# Patient Record
Sex: Female | Born: 1965 | Race: Black or African American | Hispanic: No | Marital: Married | Smoking: Never smoker
Health system: Southern US, Community
[De-identification: ages and names within clinical notes are randomized; demographics above are authoritative.]

## PROBLEM LIST (undated history)

## (undated) DIAGNOSIS — G5600 Carpal tunnel syndrome, unspecified upper limb: Secondary | ICD-10-CM

## (undated) DIAGNOSIS — J45909 Unspecified asthma, uncomplicated: Secondary | ICD-10-CM

## (undated) DIAGNOSIS — G4730 Sleep apnea, unspecified: Secondary | ICD-10-CM

## (undated) DIAGNOSIS — E785 Hyperlipidemia, unspecified: Secondary | ICD-10-CM

## (undated) DIAGNOSIS — I10 Essential (primary) hypertension: Secondary | ICD-10-CM

## (undated) DIAGNOSIS — N6009 Solitary cyst of unspecified breast: Secondary | ICD-10-CM

## (undated) DIAGNOSIS — D329 Benign neoplasm of meninges, unspecified: Secondary | ICD-10-CM

## (undated) DIAGNOSIS — E119 Type 2 diabetes mellitus without complications: Secondary | ICD-10-CM

## (undated) HISTORY — DX: Type 2 diabetes mellitus without complications: E11.9

## (undated) HISTORY — DX: Carpal tunnel syndrome, unspecified upper limb: G56.00

## (undated) HISTORY — DX: Benign neoplasm of meninges, unspecified: D32.9

## (undated) HISTORY — DX: Unspecified asthma, uncomplicated: J45.909

## (undated) HISTORY — DX: Essential (primary) hypertension: I10

## (undated) HISTORY — PX: TUBAL LIGATION: SHX77

## (undated) HISTORY — DX: Solitary cyst of unspecified breast: N60.09

## (undated) HISTORY — DX: Sleep apnea, unspecified: G47.30

## (undated) HISTORY — DX: Hyperlipidemia, unspecified: E78.5

## (undated) HISTORY — PX: ENDOMETRIAL ABLATION W/ NOVASURE: SUR434

## (undated) HISTORY — PX: CRANIOTOMY: SHX93

---

## 1997-07-30 ENCOUNTER — Emergency Department (HOSPITAL_COMMUNITY): Admission: EM | Admit: 1997-07-30 | Discharge: 1997-07-30 | Payer: Self-pay | Admitting: Emergency Medicine

## 2004-03-17 ENCOUNTER — Ambulatory Visit: Payer: Self-pay | Admitting: Family Medicine

## 2004-03-26 ENCOUNTER — Ambulatory Visit: Payer: Self-pay | Admitting: Family Medicine

## 2004-07-05 ENCOUNTER — Ambulatory Visit: Payer: Self-pay | Admitting: Family Medicine

## 2005-02-03 ENCOUNTER — Ambulatory Visit: Payer: Self-pay | Admitting: Internal Medicine

## 2005-04-25 ENCOUNTER — Ambulatory Visit: Payer: Self-pay

## 2006-05-02 ENCOUNTER — Ambulatory Visit: Payer: Self-pay | Admitting: Internal Medicine

## 2006-05-30 ENCOUNTER — Ambulatory Visit: Payer: Self-pay | Admitting: Obstetrics and Gynecology

## 2006-06-02 ENCOUNTER — Ambulatory Visit: Payer: Self-pay | Admitting: Obstetrics and Gynecology

## 2007-07-03 ENCOUNTER — Ambulatory Visit: Payer: Self-pay | Admitting: Internal Medicine

## 2008-02-05 ENCOUNTER — Ambulatory Visit: Payer: Self-pay | Admitting: Internal Medicine

## 2008-04-22 ENCOUNTER — Ambulatory Visit: Payer: Self-pay | Admitting: Unknown Physician Specialty

## 2008-07-29 ENCOUNTER — Ambulatory Visit: Payer: Self-pay | Admitting: Internal Medicine

## 2009-06-24 ENCOUNTER — Ambulatory Visit: Payer: Self-pay | Admitting: Internal Medicine

## 2009-09-29 ENCOUNTER — Ambulatory Visit: Payer: Self-pay | Admitting: Internal Medicine

## 2009-10-01 ENCOUNTER — Ambulatory Visit: Payer: Self-pay | Admitting: Internal Medicine

## 2010-01-04 ENCOUNTER — Ambulatory Visit: Payer: Self-pay | Admitting: Internal Medicine

## 2010-02-15 ENCOUNTER — Ambulatory Visit: Payer: Self-pay

## 2010-07-12 ENCOUNTER — Ambulatory Visit: Payer: Self-pay

## 2011-04-06 ENCOUNTER — Ambulatory Visit: Payer: Self-pay | Admitting: Family Medicine

## 2013-02-28 ENCOUNTER — Inpatient Hospital Stay: Payer: Self-pay | Admitting: Internal Medicine

## 2013-02-28 HISTORY — PX: BREAST CYST EXCISION: SHX579

## 2013-02-28 HISTORY — PX: BREAST CYST ASPIRATION: SHX578

## 2013-02-28 LAB — CBC WITH DIFFERENTIAL/PLATELET
Bands: 14 %
HCT: 49.1 % — ABNORMAL HIGH (ref 35.0–47.0)
HGB: 14.2 g/dL (ref 12.0–16.0)
Lymphocytes: 5 %
MCH: 26.7 pg (ref 26.0–34.0)
MCHC: 28.9 g/dL — AB (ref 32.0–36.0)
MCV: 93 fL (ref 80–100)
MONOS PCT: 10 %
MYELOCYTE: 2 %
Metamyelocyte: 2 %
PLATELETS: 467 10*3/uL — AB (ref 150–440)
RBC: 5.3 10*6/uL — ABNORMAL HIGH (ref 3.80–5.20)
RDW: 16.1 % — AB (ref 11.5–14.5)
Segmented Neutrophils: 67 %
WBC: 33.6 10*3/uL — AB (ref 3.6–11.0)

## 2013-02-28 LAB — URINALYSIS, COMPLETE
Bacteria: NONE SEEN
Bilirubin,UR: NEGATIVE
Glucose,UR: >=500 mg/dL (ref 0–75)
Granular Cast: 6
Hyaline Cast: 1
Leukocyte Esterase: NEGATIVE
Nitrite: NEGATIVE
PH: 5 (ref 4.5–8.0)
Protein: 100
RBC,UR: 2 /HPF (ref 0–5)
SPECIFIC GRAVITY: 1.023 (ref 1.003–1.030)
Squamous Epithelial: 1
WBC UR: NONE SEEN /HPF (ref 0–5)

## 2013-02-28 LAB — DRUG SCREEN, URINE
Amphetamines, Ur Screen: NEGATIVE (ref ?–1000)
Barbiturates, Ur Screen: NEGATIVE (ref ?–200)
Benzodiazepine, Ur Scrn: NEGATIVE (ref ?–200)
COCAINE METABOLITE, UR ~~LOC~~: NEGATIVE (ref ?–300)
Cannabinoid 50 Ng, Ur ~~LOC~~: NEGATIVE (ref ?–50)
MDMA (ECSTASY) UR SCREEN: NEGATIVE (ref ?–500)
Methadone, Ur Screen: NEGATIVE (ref ?–300)
Opiate, Ur Screen: NEGATIVE (ref ?–300)
PHENCYCLIDINE (PCP) UR S: NEGATIVE (ref ?–25)
Tricyclic, Ur Screen: NEGATIVE (ref ?–1000)

## 2013-02-28 LAB — COMPREHENSIVE METABOLIC PANEL
ALBUMIN: 2.9 g/dL — AB (ref 3.4–5.0)
ALT: 16 U/L (ref 12–78)
Alkaline Phosphatase: 260 U/L — ABNORMAL HIGH
Anion Gap: 20 — ABNORMAL HIGH (ref 7–16)
BUN: 40 mg/dL — ABNORMAL HIGH (ref 7–18)
Bilirubin,Total: 0.3 mg/dL (ref 0.2–1.0)
CO2: 6 mmol/L — AB (ref 21–32)
Calcium, Total: 11.1 mg/dL — ABNORMAL HIGH (ref 8.5–10.1)
Chloride: 106 mmol/L (ref 98–107)
Creatinine: 1.76 mg/dL — ABNORMAL HIGH (ref 0.60–1.30)
EGFR (African American): 39 — ABNORMAL LOW
GFR CALC NON AF AMER: 34 — AB
Glucose: 901 mg/dL (ref 65–99)
Osmolality: 319 (ref 275–301)
POTASSIUM: 4 mmol/L (ref 3.5–5.1)
SGOT(AST): 13 U/L — ABNORMAL LOW (ref 15–37)
Sodium: 132 mmol/L — ABNORMAL LOW (ref 136–145)
Total Protein: 9.6 g/dL — ABNORMAL HIGH (ref 6.4–8.2)

## 2013-02-28 LAB — PREGNANCY, URINE: Pregnancy Test, Urine: NEGATIVE m[IU]/mL

## 2013-02-28 LAB — CK TOTAL AND CKMB (NOT AT ARMC)
CK, TOTAL: 92 U/L (ref 21–215)
CK, Total: 32 U/L (ref 21–215)
CK-MB: <0.5 ng/mL — ABNORMAL LOW (ref 0.5–3.6)
CK-MB: 1.5 ng/mL (ref 0.5–3.6)

## 2013-02-28 LAB — RAPID INFLUENZA A&B ANTIGENS

## 2013-02-28 LAB — TROPONIN I

## 2013-03-01 LAB — CBC WITH DIFFERENTIAL/PLATELET
Bands: 8 %
HCT: 40.9 % (ref 35.0–47.0)
HGB: 12.7 g/dL (ref 12.0–16.0)
Lymphocytes: 12 %
MCH: 26.4 pg (ref 26.0–34.0)
MCHC: 31.2 g/dL — ABNORMAL LOW (ref 32.0–36.0)
MCV: 85 fL (ref 80–100)
Monocytes: 4 %
Myelocyte: 1 %
Platelet: 340 10*3/uL (ref 150–440)
RBC: 4.83 10*6/uL (ref 3.80–5.20)
RDW: 14.8 % — ABNORMAL HIGH (ref 11.5–14.5)
Segmented Neutrophils: 75 %
WBC: 20.3 10*3/uL — ABNORMAL HIGH (ref 3.6–11.0)

## 2013-03-01 LAB — COMPREHENSIVE METABOLIC PANEL
ALK PHOS: 175 U/L — AB
Albumin: 2.1 g/dL — ABNORMAL LOW (ref 3.4–5.0)
Anion Gap: 19 — ABNORMAL HIGH (ref 7–16)
BUN: 39 mg/dL — ABNORMAL HIGH (ref 7–18)
Bilirubin,Total: 0.4 mg/dL (ref 0.2–1.0)
CREATININE: 1.44 mg/dL — AB (ref 0.60–1.30)
Calcium, Total: 9.2 mg/dL (ref 8.5–10.1)
Chloride: 115 mmol/L — ABNORMAL HIGH (ref 98–107)
Co2: 9 mmol/L — CL (ref 21–32)
EGFR (African American): 50 — ABNORMAL LOW
EGFR (Non-African Amer.): 43 — ABNORMAL LOW
Glucose: 484 mg/dL — ABNORMAL HIGH (ref 65–99)
OSMOLALITY: 316 (ref 275–301)
Potassium: 2.6 mmol/L — ABNORMAL LOW (ref 3.5–5.1)
SGOT(AST): 17 U/L (ref 15–37)
SGPT (ALT): 13 U/L (ref 12–78)
Sodium: 143 mmol/L (ref 136–145)
Total Protein: 7.5 g/dL (ref 6.4–8.2)

## 2013-03-01 LAB — CK TOTAL AND CKMB (NOT AT ARMC)
CK, Total: 101 U/L (ref 21–215)
CK-MB: 2.2 ng/mL (ref 0.5–3.6)

## 2013-03-01 LAB — LIPID PANEL
Cholesterol: 174 mg/dL (ref 0–200)
HDL: 25 mg/dL — AB (ref 40–60)
LDL CHOLESTEROL, CALC: 93 mg/dL (ref 0–100)
TRIGLYCERIDES: 278 mg/dL — AB (ref 0–200)
VLDL CHOLESTEROL, CALC: 56 mg/dL — AB (ref 5–40)

## 2013-03-01 LAB — TSH: Thyroid Stimulating Horm: 0.31 u[IU]/mL — ABNORMAL LOW

## 2013-03-01 LAB — TROPONIN I: Troponin-I: <0.02 ng/mL

## 2013-03-01 LAB — MAGNESIUM: Magnesium: 2.1 mg/dL

## 2013-03-02 LAB — BASIC METABOLIC PANEL
ANION GAP: 9 (ref 7–16)
BUN: 47 mg/dL — AB (ref 7–18)
CREATININE: 1.89 mg/dL — AB (ref 0.60–1.30)
Calcium, Total: 9.5 mg/dL (ref 8.5–10.1)
Chloride: 127 mmol/L — ABNORMAL HIGH (ref 98–107)
Co2: 12 mmol/L — ABNORMAL LOW (ref 21–32)
EGFR (African American): 36 — ABNORMAL LOW
EGFR (Non-African Amer.): 31 — ABNORMAL LOW
GLUCOSE: 146 mg/dL — AB (ref 65–99)
Osmolality: 309 (ref 275–301)
Potassium: 3.6 mmol/L (ref 3.5–5.1)
SODIUM: 148 mmol/L — AB (ref 136–145)

## 2013-03-03 LAB — BASIC METABOLIC PANEL
BUN: 53 mg/dL — ABNORMAL HIGH (ref 7–18)
Calcium, Total: 9.8 mg/dL (ref 8.5–10.1)
Chloride: >128 mmol/L — ABNORMAL HIGH (ref 98–107)
Co2: 15 mmol/L — ABNORMAL LOW (ref 21–32)
Creatinine: 2.03 mg/dL — ABNORMAL HIGH (ref 0.60–1.30)
EGFR (African American): 33 — ABNORMAL LOW
GFR CALC NON AF AMER: 28 — AB
GLUCOSE: 334 mg/dL — AB (ref 65–99)
OSMOLALITY: 331 (ref 275–301)
Potassium: 4.4 mmol/L (ref 3.5–5.1)
SODIUM: 153 mmol/L — AB (ref 136–145)

## 2013-03-03 LAB — CBC WITH DIFFERENTIAL/PLATELET
BANDS NEUTROPHIL: 5 %
COMMENT - H1-COM3: NORMAL
HCT: 37.7 % (ref 35.0–47.0)
HGB: 12.2 g/dL (ref 12.0–16.0)
Lymphocytes: 10 %
MCH: 26.3 pg (ref 26.0–34.0)
MCHC: 32.5 g/dL (ref 32.0–36.0)
MCV: 81 fL (ref 80–100)
METAMYELOCYTE: 2 %
Monocytes: 4 %
PLATELETS: 320 10*3/uL (ref 150–440)
RBC: 4.64 10*6/uL (ref 3.80–5.20)
RDW: 14.4 % (ref 11.5–14.5)
Segmented Neutrophils: 79 %
WBC: 20.5 10*3/uL — ABNORMAL HIGH (ref 3.6–11.0)

## 2013-03-03 LAB — VANCOMYCIN, TROUGH: Vancomycin, Trough: 17 ug/mL (ref 10–20)

## 2013-03-04 LAB — BASIC METABOLIC PANEL
Anion Gap: 10 (ref 7–16)
BUN: 45 mg/dL — ABNORMAL HIGH (ref 7–18)
Calcium, Total: 8.5 mg/dL (ref 8.5–10.1)
Chloride: 119 mmol/L — ABNORMAL HIGH (ref 98–107)
Co2: 16 mmol/L — ABNORMAL LOW (ref 21–32)
Creatinine: 1.7 mg/dL — ABNORMAL HIGH (ref 0.60–1.30)
EGFR (African American): 41 — ABNORMAL LOW
EGFR (Non-African Amer.): 35 — ABNORMAL LOW
GLUCOSE: 398 mg/dL — AB (ref 65–99)
OSMOLALITY: 317 (ref 275–301)
POTASSIUM: 3.7 mmol/L (ref 3.5–5.1)
Sodium: 145 mmol/L (ref 136–145)

## 2013-03-05 LAB — PATHOLOGY REPORT

## 2013-03-05 LAB — CBC WITH DIFFERENTIAL/PLATELET
Bands: 8 %
EOS PCT: 2 %
HCT: 33 % — ABNORMAL LOW (ref 35.0–47.0)
HGB: 10.9 g/dL — AB (ref 12.0–16.0)
LYMPHS PCT: 21 %
MCH: 26.6 pg (ref 26.0–34.0)
MCHC: 33.1 g/dL (ref 32.0–36.0)
MCV: 80 fL (ref 80–100)
METAMYELOCYTE: 1 %
MYELOCYTE: 2 %
Monocytes: 7 %
NRBC/100 WBC: 1 /
PLATELETS: 241 10*3/uL (ref 150–440)
RBC: 4.11 10*6/uL (ref 3.80–5.20)
RDW: 13.9 % (ref 11.5–14.5)
SEGMENTED NEUTROPHILS: 59 %
WBC: 16.1 10*3/uL — ABNORMAL HIGH (ref 3.6–11.0)

## 2013-03-05 LAB — WOUND CULTURE

## 2013-03-05 LAB — BASIC METABOLIC PANEL
Anion Gap: 6 — ABNORMAL LOW (ref 7–16)
BUN: 37 mg/dL — ABNORMAL HIGH (ref 7–18)
Calcium, Total: 8.5 mg/dL (ref 8.5–10.1)
Chloride: 117 mmol/L — ABNORMAL HIGH (ref 98–107)
Co2: 20 mmol/L — ABNORMAL LOW (ref 21–32)
Creatinine: 1.8 mg/dL — ABNORMAL HIGH (ref 0.60–1.30)
EGFR (African American): 38 — ABNORMAL LOW
EGFR (Non-African Amer.): 33 — ABNORMAL LOW
GLUCOSE: 239 mg/dL — AB (ref 65–99)
OSMOLALITY: 301 (ref 275–301)
Potassium: 3.2 mmol/L — ABNORMAL LOW (ref 3.5–5.1)
SODIUM: 143 mmol/L (ref 136–145)

## 2013-03-05 LAB — CULTURE, BLOOD (SINGLE)

## 2013-03-06 LAB — COMPREHENSIVE METABOLIC PANEL
ANION GAP: 8 (ref 7–16)
AST: 27 U/L (ref 15–37)
Albumin: 1.4 g/dL — ABNORMAL LOW (ref 3.4–5.0)
Alkaline Phosphatase: 121 U/L — ABNORMAL HIGH
BILIRUBIN TOTAL: 0.3 mg/dL (ref 0.2–1.0)
BUN: 31 mg/dL — ABNORMAL HIGH (ref 7–18)
CALCIUM: 7.7 mg/dL — AB (ref 8.5–10.1)
CHLORIDE: 117 mmol/L — AB (ref 98–107)
CREATININE: 1.68 mg/dL — AB (ref 0.60–1.30)
Co2: 16 mmol/L — ABNORMAL LOW (ref 21–32)
EGFR (Non-African Amer.): 36 — ABNORMAL LOW
GFR CALC AF AMER: 41 — AB
Glucose: 231 mg/dL — ABNORMAL HIGH (ref 65–99)
Osmolality: 295 (ref 275–301)
Potassium: 4.2 mmol/L (ref 3.5–5.1)
SGPT (ALT): 10 U/L — ABNORMAL LOW (ref 12–78)
Sodium: 141 mmol/L (ref 136–145)
TOTAL PROTEIN: 5.2 g/dL — AB (ref 6.4–8.2)

## 2013-03-06 LAB — PATHOLOGY REPORT

## 2013-03-06 LAB — VANCOMYCIN, TROUGH: VANCOMYCIN, TROUGH: 17 ug/mL (ref 10–20)

## 2013-03-07 LAB — CBC WITH DIFFERENTIAL/PLATELET
Bands: 4 %
Eosinophil: 1 %
HCT: 31.8 % — AB (ref 35.0–47.0)
HGB: 10.6 g/dL — AB (ref 12.0–16.0)
LYMPHS PCT: 18 %
MCH: 26.6 pg (ref 26.0–34.0)
MCHC: 33.3 g/dL (ref 32.0–36.0)
MCV: 80 fL (ref 80–100)
METAMYELOCYTE: 1 %
Monocytes: 8 %
Myelocyte: 1 %
PLATELETS: 207 10*3/uL (ref 150–440)
RBC: 3.99 10*6/uL (ref 3.80–5.20)
RDW: 14.1 % (ref 11.5–14.5)
Segmented Neutrophils: 67 %
WBC: 15.2 10*3/uL — AB (ref 3.6–11.0)

## 2013-03-08 LAB — WOUND CULTURE

## 2013-05-10 ENCOUNTER — Ambulatory Visit: Payer: Self-pay | Admitting: Family Medicine

## 2013-07-09 ENCOUNTER — Ambulatory Visit: Payer: Self-pay | Admitting: Surgery

## 2014-06-21 NOTE — Op Note (Signed)
PATIENT NAME:  Sara Welch, Sara Welch MR#:  [id number] DATE OF BIRTH:  02/26/1966  DATE OF PROCEDURE:  03/05/2013  PREOPERATIVE DIAGNOSIS: Necrotizing soft tissue infection of the right breast.    POSTOPERATIVE DIAGNOSIS: Necrotizing soft tissue infection of the right breast.   POSTOPERATIVE DIAGNOSIS: Examination under anesthesia, debridement of soft tissue infection of the right breast wound and wound VAC placement.   SURGEON: Phoebe Perch, M.D.   ASSISTANT: PA student.   INDICATIONS: This is a patient who has a necrotizing soft tissue infection of the right breast inframammary fold and chest wall requiring examination under anesthesia and further debridement and possible wound VAC placement. Preoperatively, we discussed the rationale for this procedure and the risks of bleeding and the need for additional therapy, including surgery. They understood and agreed to proceed.   FINDINGS: Minimal necrosis at the inferior edge of the skin and subcutaneous tissues. The wound measured 8 x 15 cm following debridement.   DESCRIPTION OF PROCEDURE: The patient was induced to general anesthesia. She was on IV antibiotics and VTE prophylaxis was in place. She was prepped and draped in a sterile fashion. After a surgical pause  was performed, blunt debridement utilizing a laparotomy pad was performed to remove Silvadene dressing and superficial adherent purulence and necrotic tissue. Once this was performed, there was good bleeding throughout the wound, except on the inferior edge. Electrocautery was utilized to dissect off approximately a 1 x 1 cm by a 12 cm strip of tissue, which was sent off for examination. Hemostasis was with electrocautery. This tissue was in the subcutaneous fat. The fascia of the chest wall appeared to be uninvolved with infection or necrosis. Further rough debridement demonstrated good bleeding throughout the wound. Hemostasis was with electrocautery and then a tailored piece of black  foam was placed into the wound and a wound VAC placed and tailored appropriately. Suction was applied and there were no air leaks.   The patient tolerated this procedure well. There were no complications. She was taken to the recovery room in stable condition to be admitted for continued care.    ____________________________ Jerrol Banana. Burt Knack, MD rec:aw D: 03/05/2013 12:10:31 ET T: 03/05/2013 13:16:00 ET JOB#: [id number]  cc: Jerrol Banana. Burt Knack, MD, <Dictator> Florene Glen MD ELECTRONICALLY SIGNED 03/05/2013 17:38

## 2014-06-21 NOTE — Discharge Summary (Signed)
PATIENT NAME:  Sara Welch, Sara Welch MR#:  [id number] DATE OF BIRTH:  January 26, 1966  DATE OF ADMISSION:  02/28/2013 DATE OF DISCHARGE:  03/07/2013  Please refer to the interim summary dictated 03/06/2013.  Admitting diagnosis and discharge diagnosis is all the same.  All the information on the interim summary is the same.  There are no acute changes.   DISCHARGE MEDICATIONS: 1.  Lantus 50 units daily.  2.  NovoLog 10 units daily.  3.  Acetaminophen/oxycodone 325/5 q. 6 hours p.r.n. #20, pain. 4.  Ciprofloxacin 500 mg q. 12 hours x 10 days.  DISCHARGE: The patient will be discharged with home health and wound VAC.  DIET:  The patient is on ADA diet.   FOLLOWUP:  The patient will follow up in 10 days with surgery and will follow up with Dr. Hortencia Pilar in 10 to 14 days as well.   ADMITTING DIAGNOSIS:  Unresponsiveness.  DISCHARGE DIAGNOSES:  Acute encephalopathy as a result of diabetic ketoacidosis, as a result of breast abscess. 2.  Acute respiratory failure secondary to acute encephalopathy and airway protection.  3.  Diabetic ketoacidosis due to breast abscess and infection.  4.  Elevated creatinine, likely due to chronic renal failure.  5.  Right breast abscess status post incision and drainage.  6.  History of asthma without evidence of acute exacerbation.  7.  Tricuspid regurgitation and pulmonary hypertension on echocardiogram needing outpatient study.   TIME SPENT:  Approximately 35 minutes.   ____________________________ Donell Beers. Benjie Karvonen, MD spm:ce D: 03/07/2013 14:32:07 ET T: 03/07/2013 20:22:27 ET JOB#: [id number]  cc: Kaitlin Alcindor P. Benjie Karvonen, MD, <Dictator> Kerin Perna, MD Jerrol Banana. Burt Knack, MD  Donell Beers Samary Shatz MD ELECTRONICALLY SIGNED 03/08/2013 16:44

## 2014-06-21 NOTE — Op Note (Signed)
PATIENT NAME:  DEMECIA, NORTHWAY MR#:  [id number] DATE OF BIRTH:  [birth year]  DATE OF PROCEDURE:  02/28/2013  PREOPERATIVE DIAGNOSIS: Diabetic ketoacidosis and right breast abscess.   POSTOPERATIVE DIAGNOSIS:  Right inframammary crease. Subcutaneous abscess consistent with infected sebaceous cyst.   PROCEDURE PERFORMED: Incision and drainage of right inframammary crease abscess.   SURGEON: Sherri Rad, M.D.   ASSISTANT: None.   ANESTHESIA: None.   SPECIMENS: Pus.   FINDINGS: Large amount of thick foul-smelling pus was 5 x 5 cm cavity down to the chest wall consistent with an infected sebaceous cyst.   DESCRIPTION OF PROCEDURE: With informed consent obtained from the patient's family, she was positioned already in the bed on ventilatory circuit and has been unresponsive since admission. The area in the right inframammary crease was sterilely prepped and draped with Betadine solution. Timeout was observed. A #11 blade was used to excise a necrotic piece of skin. A large amount of pus was encountered. The incision was widened to approximately 2.5 cm in size. A copious amount of approximately 125 mL of thick, creamy, brown, foul-smelling pus was drained. The wound was then inspected and loculations were disrupted with finger fracture technique. The wound was debrided of sebaceous appearing material and packed open with  Betadine-soaked gauze. Sterile dressing was applied. The patient tolerated the procedure without immediate complication.    ____________________________ Jeannette How Marina Gravel, MD mab:cc D: 02/28/2013 22:37:39 ET T: 02/28/2013 22:51:04 ET JOB#: [id number]  cc: Elta Guadeloupe A. Marina Gravel, MD, <Dictator> Jayani Rozman A Twain Stenseth MD ELECTRONICALLY SIGNED 03/01/2013 1:57

## 2014-06-21 NOTE — Op Note (Signed)
PATIENT NAME:  Sara Welch, BRALLEY MR#:  [id number] DATE OF BIRTH:  06-10-1965  DATE OF PROCEDURE:  03/04/2013  PREOPERATIVE DIAGNOSIS: Right breast abscess.   POSTOPERATIVE DIAGNOSIS: Right breast necrotizing infection.  PROCEDURE: Incision, drainage and debridement of large right breast necrotizing soft tissue infection.   SURGEON: Phoebe Perch, M.D.   ANESTHESIA: General with LMA.   INDICATIONS: This is a patient with a right breast abscess requiring exam under anesthesia and debridement and drainage. Preoperatively, she and I discussed the rationale for offering this surgery, the options of observation, risk of bleeding, infection, recurrence of symptoms, the need for an open wound and further surgery. This was reviewed for her in the preop holding area. She understood and agreed to proceed. No family was present.   FINDINGS: Necrotizing soft tissue infection with frank necrosis of the skin and necrotic fat down to the chest wall and including a portion the chest wall fascia.   DESCRIPTION OF PROCEDURE: The patient was induced to general anesthesia. She was prepped and draped in a sterile fashion. She was on IV antibiotics.   Inspection of the wound demonstrated a single 4 x 4 purulent soaked, which was removed from an area of frank necrosis. This frank necrosis was excised using electrocautery, dissection and further excision and debridement down to the chest wall was performed with electrocautery. Hemostasis was with electrocautery. The specimen was passed off for examination in formalin. No sign of malignancy was identified. Further debridement down to chest wall and including some chest wall fascia was performed utilizing the electrocautery. The wound was finally measured at 14 x 7 cm, involving the right breast inframammary fold and chest wall. Once assuring that hemostasis was adequate, Silvadene dressing was placed with Kerlix and ABD pads. The patient tolerated this procedure well.  There were no complications. She was taken to the recovery room in stable condition to be admitted for continued care. Sponge, lap and needle counts were correct.    ____________________________ Jerrol Banana. Burt Knack, MD rec:aw D: 03/04/2013 86:57:84 ET T: 03/04/2013 09:17:50 ET JOB#: [id number]  cc: Jerrol Banana. Burt Knack, MD, <Dictator> Florene Glen MD ELECTRONICALLY SIGNED 03/04/2013 18:24

## 2014-06-21 NOTE — H&P (Signed)
PATIENT NAME:  AUBRY, RANKIN MR#:  [id number] DATE OF BIRTH:  1966/01/14  DATE OF ADMISSION:  02/28/2013  PRIMARY CARE PHYSICIAN: At [facility].   REFERRING ER PHYSICIAN: Dr. Thomasene Lot  CHIEF COMPLAINT: Unresponsiveness.  HISTORY OF PRESENTING ILLNESS: A 49 year old female with history of diabetes and asthma, who is obese, and following with [facility] for her diabetes, on insulin. Last week she went to her primary care doctor, as she found some cyst in her right breast. They examined and gave her oxycodone for pain, as it was painful, and told her to get further workup. She did not have a chance to go for further workup yet, so for the last 3 to 4 days she was taking oxycodone, but she did not take any yesterday. Two days ago she had some shortness of breath, and she went to the primary care center again, and they gave her some nebulizer, but no steroids, and her asthma resolved and sent home. As per husband, she did not have any other complaint of problems, but she did throw up once in the last 2 days, and today morning he found her a little bit more sleepy and drowsy. He thought maybe she was tired and just let her relax. He went out for a few hours for something. When in the afternoon he returned back home, he found her unresponsive so called EMS, brought over here. Intubated for airway protection, and found having severe acidosis and blood sugar level very high. On further workup for infection, CT head was found negative. Her chest x-ray is negative, and urinalysis is negative, so she is being admitted for further workup and treatment of DKA.  REVIEW OF SYSTEMS:  Not able to get it, as patient is unresponsive now, but husband says that she did not have any diarrhea, vomiting, urinary symptoms, or shortness of breath. As per husband, she might have missed her insulin injections yesterday because she was not feeling very well yesterday.  PAST MEDICAL HISTORY:  1.  Diabetes.  2.   Asthma.  3.  Some brain mass, and surgery was done 10 years ago, which was noncancerous.   SURGICAL HISTORY: Breast cyst in left breast, which was surgically removed, noncancerous.   SOCIAL HISTORY: Lives at home with husband. 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 She is a nonsmoker, no alcohol drinking, and no illegal drug use.   FAMILY HISTORY: Positive for diabetes in multiple family members.   HOME MEDICATIONS:  Currently husband does not remember the names, but she is taking 2 different kinds of insulin. One is every day at fixed time, and the other one with meals 2 times, and she has some inhalers for her asthma, and was given oxycodone for the last 3 to 4 days for her breast cyst.   VITAL SIGNS: In ER, temperature 99.2, pulse rate 140, blood pressure 154/71, and pulse ox 100% on ventilator.   PHYSICAL EXAMINATION: GENERAL: The patient is currently sedated, on vent support. She is obese. Minimal response to painful stimuli still. EYES:  Pupils reactive to light.  HEENT: Head and neck atraumatic.  NECK:  Supple. No JVD. ET tube in place.  RESPIRATORY: Bilateral clear and equal air entry.  CARDIOVASCULAR: S1, S2 present. Tachycardia, regular. No murmur.  ABDOMEN: Obese. Bowel sounds present. No mass felt.  SKIN: Under the right breast, there is a red and fluctuant area present, which is little warm to touch, size 4 cm x 4 cm.  No rashes anywhere else on the body.  LEGS: No edema.  NEUROLOGICAL: Currently sedated, but minimal withdrawal to painful stimuli.   IMPORTANT LAB RESULTS:  Glucose 901, BUN 40, creatinine 1.76, sodium 132, potassium 4.0, chloride 106, CO2 of 6, calcium is 11.1. Total protein 9.6, albumin 2.9, bilirubin 0.3, alkaline phosphate 260, SGOT 30, SGPT 16. Troponin less than 0.02. Urine for toxicology is negative. White cell count is 33.6, hemoglobin 14.2, platelet count 467. Influenza A and B are negative. Urinalysis is negative for any infection.  ABG is done:  pH less than 6.9, pCO2 less than 19, pO2 of 221, oxygen saturation 100% FiO2, PEEP of 5. Lactic acid 1.9. X-ray chest portable shows no focal lung disease. CT scan of the head without contrast shows no acute finding or mass lesions.   ASSESSMENT AND PLAN: A 49 year old female with history of diabetes and some breast mass, found unresponsive by husband. Was a little lethargic for 2 to 3 days, and had some breast mass which was painful, and was given pain medicine for that. Found having diabetic ketoacidosis and unresponsive, on ventilator management currently.   1.  Diabetic ketoacidosis. Will admit her to Critical Care Unit, put her on insulin IV drip for DKA protocol. Will give IV fluid and follow frequently her blood gas and BMP for further management.   2.  Altered mental status and unresponsiveness. Most likely it is due to metabolic and toxic encephalopathy secondary to severe acidosis as a result of DKA. Will treat underlying cause. CT of the head is negative for any acute finding at this time.   3.  Elevated white cell count. Chest x-ray and urinalysis are negative, but there is some mass which is fluctuant in right breast. Most likely it is breast abscess. I spoke to Dr. Marina Gravel, who is on-call surgery doctor at this time, and he will be consulted for that. Currently will give her IV vancomycin, Zosyn, and Levaquin for broad-spectrum coverage for any other infection, and we sent blood cultures for further identification of any organisms, if we find.   4.  History of asthma. Currently, there is no wheezing present, so there is no treatment needed for this issue at this time, but we might need to add some nebulizer later on if needed.   5.  Acute respiratory failure due to altered mental status. She is intubated and on ventilatory support. Will treat the underlying cause, and Pulmonary consult for vent management. Condition is critical due to severe acidosis and altered mental status,  with needing intubation. Will continue monitoring in Critical Care Unit.   Condition and plan discussed with patient's family members, who are present in the room. She remains a FULL CODE Total critical care time spent on this admission:  60 minutes.    ____________________________ Ceasar Lund Anselm Jungling, MD vgv:mr D: 02/28/2013 19:40:23 ET T: 02/28/2013 20:20:51 ET JOB#: [id number]  cc: Ceasar Lund. Anselm Jungling, MD, <Dictator> Vaughan Basta MD ELECTRONICALLY SIGNED 03/06/2013 13:54

## 2014-11-04 ENCOUNTER — Other Ambulatory Visit: Payer: Self-pay | Admitting: Family Medicine

## 2014-11-04 DIAGNOSIS — Z1231 Encounter for screening mammogram for malignant neoplasm of breast: Secondary | ICD-10-CM

## 2014-11-07 ENCOUNTER — Ambulatory Visit
Admission: RE | Admit: 2014-11-07 | Discharge: 2014-11-07 | Disposition: A | Discharge status: Home / Self Care | Payer: BC Managed Care – PPO | Source: Ambulatory Visit | Attending: Family Medicine | Admitting: Family Medicine

## 2014-11-07 DIAGNOSIS — Z1231 Encounter for screening mammogram for malignant neoplasm of breast: Secondary | ICD-10-CM | POA: Diagnosis not present

## 2015-03-01 HISTORY — PX: REDUCTION MAMMAPLASTY: SUR839

## 2015-09-02 ENCOUNTER — Other Ambulatory Visit: Payer: Self-pay | Admitting: Obstetrics and Gynecology

## 2015-09-02 DIAGNOSIS — Z1231 Encounter for screening mammogram for malignant neoplasm of breast: Secondary | ICD-10-CM

## 2015-09-18 ENCOUNTER — Encounter: Payer: BC Managed Care – PPO | Attending: Internal Medicine | Admitting: *Deleted

## 2015-09-18 ENCOUNTER — Encounter: Payer: Self-pay | Admitting: *Deleted

## 2015-09-18 VITALS — BP 128/72 | Ht 65.0 in | Wt 250.7 lb

## 2015-09-18 DIAGNOSIS — Z794 Long term (current) use of insulin: Secondary | ICD-10-CM

## 2015-09-18 DIAGNOSIS — E119 Type 2 diabetes mellitus without complications: Secondary | ICD-10-CM | POA: Insufficient documentation

## 2015-09-18 NOTE — Patient Instructions (Signed)
Check blood sugars 3-4 x day before each meal and before bed every day  Exercise: Begin walking  for  15  Minutes   3  days a week and gradually increase to 150 minutes/week No strenuous exercise when your blood sugar is greater than 250   Eat 3 meals day,  1-2  snacks a day Space meals 4-6 hours apart Don't skip meals Avoid sugar sweetened drinks (juices) unless treating a low blood sugar  Make an eye doctor appointment  Carry fast acting glucose and a snack at all times  Rotate injection sites  Call back if you want to schedule an appointment with the dietitian

## 2015-09-18 NOTE — Progress Notes (Signed)
Diabetes Self-Management Education  Visit Type: First/Initial  Appt. Start Time: 0910 Appt. End Time: [id number]  09/18/2015  [name], identified by name and date of birth, is a 50 y.o. female with a diagnosis of Diabetes: Type 2.   ASSESSMENT  Blood pressure 128/72, height 5\' 5"  (1.651 m), weight 250 lb 11.2 oz (113.717 kg). Body mass index is 41.72 kg/(m^2).      Diabetes Self-Management Education - 09/18/15 1011    Visit Information   Visit Type First/Initial   Initial Visit   Diabetes Type Type 2   Are you currently following a meal plan? No   Are you taking your medications as prescribed? Yes   Date Diagnosed 10 + years   Health Coping   How would you rate your overall health? Fair   Psychosocial Assessment   Patient Belief/Attitude about Diabetes Motivated to manage diabetes  "sometimes it's challenging"   Self-care barriers None   Self-management support Doctor's office;Family   Patient Concerns Nutrition/Meal planning;Glycemic Control;Problem Solving;Weight Control;Healthy Lifestyle;Medication   Special Needs None   Preferred Learning Style Auditory;Visual;Hands on   Learning Readiness Ready   How often do you need to have someone help you when you read instructions, pamphlets, or other written materials from your doctor or pharmacy? 1 - Never   What is the last grade level you completed in school? 12 + college   Pre-Education Assessment   Patient understands the diabetes disease and treatment process. Needs Review   Patient understands incorporating nutritional management into lifestyle. Needs Instruction   Patient undertands incorporating physical activity into lifestyle. Needs Instruction   Patient understands using medications safely. Needs Review   Patient understands monitoring blood glucose, interpreting and using results Needs Review   Patient understands prevention, detection, and treatment of acute complications. Needs Review   Patient understands  prevention, detection, and treatment of chronic complications. Needs Review   Patient understands how to develop strategies to address psychosocial issues. Needs Instruction   Patient understands how to develop strategies to promote health/change behavior. Needs Instruction   Complications   Last HgB A1C per patient/outside source 9.9 %  08/20/15   How often do you check your blood sugar? 3-4 times/day   Fasting Blood glucose range (mg/dL) 70-129;130-179;>200  FBG improved in last 2 days since increase in insulin (151 and 118 mg/dL today). Other FBG's 178-368 mg/dL   Postprandial Blood glucose range (mg/dL) 70-129;>200;180-200;130-179  pp's 152-316 mg/dL   Have you had a dilated eye exam in the past 12 months? No   Have you had a dental exam in the past 12 months? Yes   Are you checking your feet? Yes   How many days per week are you checking your feet? 7   Dietary Intake   Breakfast skips meals; bacon, eggs, toast or biscuit; grits or oatmeal   Snack (morning) fruit   Lunch leftovers from supper; salad with chicken   Snack (afternoon) fruit   Dinner chicken, pork chop or fish - baked; vegetables, rice or potato   Beverage(s) water, diet soda, juice, sugar free drinks   Exercise   Exercise Type ADL's   Patient Education   Previous Diabetes Education Yes (please comment)   Disease state  Factors that contribute to the development of diabetes   Nutrition management  Role of diet in the treatment of diabetes and the relationship between the three main macronutrients and blood glucose level;Carbohydrate counting   Physical activity and exercise  Role of exercise on  diabetes management, blood pressure control and cardiac health.   Medications Taught/reviewed insulin injection, site rotation, insulin storage and needle disposal.;Reviewed patients medication for diabetes, action, purpose, timing of dose and side effects.   Monitoring Purpose and frequency of SMBG.;Identified appropriate SMBG  and/or A1C goals.   Acute complications Taught treatment of hypoglycemia - the 15 rule.   Chronic complications Relationship between chronic complications and blood glucose control   Psychosocial adjustment Identified and addressed patients feelings and concerns about diabetes   Individualized Goals (developed by patient)   Reducing Risk Improve blood sugars Decrease medications Prevent diabetes complications Lose weight Lead a healthier lifestyle Become more fit   Outcomes   Expected Outcomes Demonstrated interest in learning. Expect positive outcomes   Future DMSE PRN   Program Status Not Completed      Individualized Plan for Diabetes Self-Management Training:   Learning Objective:  Patient will have a greater understanding of diabetes self-management. Patient education plan is to attend individual and/or group sessions per assessed needs and concerns.   Plan:   Patient Instructions  Check blood sugars 3-4 x day before each meal and before bed every day Exercise: Begin walking  for  15  Minutes   3  days a week and gradually increase to 150 minutes/week No strenuous exercise when your blood sugar is greater than 250  Eat 3 meals day,  1-2  snacks a day Space meals 4-6 hours apart Don't skip meals Avoid sugar sweetened drinks (juices) unless treating a low blood sugar Make an eye doctor appointment Carry fast acting glucose and a snack at all times Rotate injection sites Call back if you want to schedule an appointment with the dietitian   Expected Outcomes:  Demonstrated interest in learning. Expect positive outcomes  Education material provided:  General Meal Planning Guidelines Simple Meal Plan Planning A Balanced Meal Glucose tablets Symptoms, causes and treatments of Hypoglycemia  If problems or questions, patient to contact team via:  Johny Drilling, [place], [place], CDE [phone]  Future DSME appointment: PRN  Pt isn't sure if insurance will cover and she  didn't want to schedule another appointment at this time. Provided her with a financial profile to complete.

## 2015-11-09 ENCOUNTER — Ambulatory Visit: Admission: RE | Admit: 2015-11-09 | Payer: BC Managed Care – PPO | Source: Ambulatory Visit

## 2016-09-13 ENCOUNTER — Other Ambulatory Visit: Payer: Self-pay | Admitting: Obstetrics and Gynecology

## 2016-09-13 DIAGNOSIS — Z1231 Encounter for screening mammogram for malignant neoplasm of breast: Secondary | ICD-10-CM

## 2017-02-13 ENCOUNTER — Ambulatory Visit
Admission: RE | Admit: 2017-02-13 | Discharge: 2017-02-13 | Disposition: A | Discharge status: Home / Self Care | Payer: BC Managed Care – PPO | Source: Ambulatory Visit | Attending: Obstetrics and Gynecology | Admitting: Obstetrics and Gynecology

## 2017-02-13 DIAGNOSIS — Z1231 Encounter for screening mammogram for malignant neoplasm of breast: Secondary | ICD-10-CM | POA: Insufficient documentation

## 2018-10-10 ENCOUNTER — Other Ambulatory Visit: Payer: Self-pay | Admitting: Family Medicine

## 2018-10-10 DIAGNOSIS — Z1231 Encounter for screening mammogram for malignant neoplasm of breast: Secondary | ICD-10-CM

## 2018-11-19 ENCOUNTER — Ambulatory Visit
Admission: RE | Admit: 2018-11-19 | Discharge: 2018-11-19 | Disposition: A | Discharge status: Home / Self Care | Payer: BC Managed Care – PPO | Source: Ambulatory Visit | Attending: Family Medicine | Admitting: Family Medicine

## 2018-11-19 DIAGNOSIS — Z1231 Encounter for screening mammogram for malignant neoplasm of breast: Secondary | ICD-10-CM | POA: Diagnosis present

## 2019-12-05 ENCOUNTER — Other Ambulatory Visit: Payer: Self-pay | Admitting: Family Medicine

## 2019-12-05 DIAGNOSIS — Z1231 Encounter for screening mammogram for malignant neoplasm of breast: Secondary | ICD-10-CM

## 2020-01-03 ENCOUNTER — Ambulatory Visit
Admission: RE | Admit: 2020-01-03 | Discharge: 2020-01-03 | Disposition: A | Discharge status: Home / Self Care | Payer: BC Managed Care – PPO | Source: Ambulatory Visit | Attending: Family Medicine | Admitting: Family Medicine

## 2020-01-03 ENCOUNTER — Other Ambulatory Visit: Payer: Self-pay

## 2020-01-03 DIAGNOSIS — Z1231 Encounter for screening mammogram for malignant neoplasm of breast: Secondary | ICD-10-CM | POA: Diagnosis not present

## 2020-11-27 ENCOUNTER — Other Ambulatory Visit: Payer: Self-pay | Admitting: Family Medicine

## 2020-11-27 DIAGNOSIS — Z1231 Encounter for screening mammogram for malignant neoplasm of breast: Secondary | ICD-10-CM

## 2021-01-07 ENCOUNTER — Other Ambulatory Visit: Payer: Self-pay

## 2021-01-07 ENCOUNTER — Ambulatory Visit
Admission: RE | Admit: 2021-01-07 | Discharge: 2021-01-07 | Disposition: A | Discharge status: Home / Self Care | Payer: BC Managed Care – PPO | Source: Ambulatory Visit | Attending: Family Medicine | Admitting: Family Medicine

## 2021-01-07 DIAGNOSIS — Z1231 Encounter for screening mammogram for malignant neoplasm of breast: Secondary | ICD-10-CM | POA: Diagnosis present

## 2021-05-11 ENCOUNTER — Ambulatory Visit: Payer: BC Managed Care – PPO | Admitting: Plastic Surgery

## 2021-05-11 ENCOUNTER — Encounter: Payer: Self-pay | Admitting: Plastic Surgery

## 2021-05-11 ENCOUNTER — Other Ambulatory Visit: Payer: Self-pay

## 2021-05-11 DIAGNOSIS — G8929 Other chronic pain: Secondary | ICD-10-CM

## 2021-05-11 DIAGNOSIS — M546 Pain in thoracic spine: Secondary | ICD-10-CM | POA: Diagnosis not present

## 2021-05-11 DIAGNOSIS — M542 Cervicalgia: Secondary | ICD-10-CM

## 2021-05-11 DIAGNOSIS — N62 Hypertrophy of breast: Secondary | ICD-10-CM

## 2021-05-11 DIAGNOSIS — M549 Dorsalgia, unspecified: Secondary | ICD-10-CM | POA: Insufficient documentation

## 2021-05-11 NOTE — Progress Notes (Signed)
? ?  Patient ID: Sara Welch, female    DOB: March 29, 1965, 56 y.o.   MRN: [id number] ? ? ?Chief Complaint  ?Patient presents with  ? Advice Only  ? Breast Problem  ? ? ?Mammary Hyperplasia: ?The patient is a 56 y.o. female with a history of mammary hyperplasia for several years.  She underwent bilateral breast reductions in 2017 at [facility].  She had chronic cysts that caused a hospitalization with the infection and MRSA.  She is pleased with her current breast size.  What she does not like is the excess tissue in her axillary area.  She is 5 feet 5 inches tall and weighs 244 pounds.  The BMI = 40.6 kg/m?Marland Kitchen  Preoperative bra size = 40-44 DD cup.  Mammogram history: 1/23 and was negative.  Family history of breast cancer:  no.  Tobacco use:  no.  She has diabetes but is currently under control.  I do not have a current hemoglobin A1c for her.  She is not on a blood thinner.  The patient expresses the desire to pursue surgical intervention. ? ? ? ?Review of Systems  ?Constitutional:  Positive for activity change. Negative for appetite change.  ?Eyes: Negative.   ?Respiratory:  Negative for chest tightness.   ?Cardiovascular:  Negative for leg swelling.  ?Gastrointestinal: Negative.   ?Endocrine: Negative.   ?Genitourinary: Negative.   ?Musculoskeletal: Negative.   ?Skin:  Positive for rash.  ?Neurological: Negative.   ?Hematological: Negative.   ? ?Past Medical History:  ?Diagnosis Date  ? Asthma   ? Carpal tunnel syndrome   ? Cyst, breast   ? Diabetes mellitus without complication ([place])   ? Hyperlipidemia   ? Hypertension   ? Meningioma ([place])   ? Sleep apnea   ?  ?Past Surgical History:  ?Procedure Laterality Date  ? BREAST CYST ASPIRATION Left 2015  ? SEVERAL SEBACIOUS CYSTS LANCED  ? BREAST CYST EXCISION Right 2015  ? SEBACIOUS CYST REMOVED and MRSA infection   ? CESAREAN SECTION    ? CRANIOTOMY    ? ENDOMETRIAL ABLATION W/ NOVASURE    ? REDUCTION MAMMAPLASTY Bilateral 2017  ? TUBAL LIGATION    ?  ? ? ?Current  Outpatient Medications:  ?  albuterol (PROVENTIL) (5 MG/ML) 0.5% nebulizer solution, Inhale 2.5 mg into the lungs every 4 (four) hours as needed. , Disp: , Rfl:  ?  Albuterol Sulfate (PROAIR RESPICLICK) 734 (90 Base) MCG/ACT AEPB, Inhale 2 puffs into the lungs every 4 (four) hours as needed. , Disp: , Rfl:  ?  aspirin EC 81 MG tablet, Take 81 mg by mouth daily. , Disp: , Rfl:  ?  cetirizine (ZYRTEC) 10 MG tablet, Take 10 mg by mouth daily as needed. , Disp: , Rfl:  ?  clindamycin (CLEOCIN) 300 MG capsule, Take 300 mg by mouth 3 (three) times daily. Reported on 09/18/2015, Disp: , Rfl:  ?  Fluticasone-Salmeterol (ADVAIR DISKUS) 250-50 MCG/DOSE AEPB, Inhale 1 puff into the lungs 2 (two) times daily. Reported on 09/18/2015, Disp: , Rfl:  ?  hydrochlorothiazide (HYDRODIURIL) 12.5 MG tablet, Take 12.5 mg by mouth daily. , Disp: , Rfl:  ?  insulin regular human CONCENTRATED (HUMULIN R) 500 UNIT/ML kwikpen, Inject 125 Units into the skin daily with breakfast. 100 units before supper, Disp: , Rfl:  ?  lisinopril (PRINIVIL,ZESTRIL) 2.5 MG tablet, Take 2.5 mg by mouth daily. , Disp: , Rfl:  ?  montelukast (SINGULAIR) 10 MG tablet, Take 10 mg by mouth at  bedtime. , Disp: , Rfl:   ? ?Objective:  ? ?There were no vitals filed for this visit. ? ?Physical Exam ?Vitals and nursing note reviewed.  ?Constitutional:   ?   Appearance: Normal appearance.  ?HENT:  ?   Head: Normocephalic and atraumatic.  ?Cardiovascular:  ?   Rate and Rhythm: Normal rate.  ?   Pulses: Normal pulses.  ?Pulmonary:  ?   Effort: Pulmonary effort is normal. No respiratory distress.  ?   Breath sounds: No wheezing.  ?Abdominal:  ?   General: There is no distension.  ?   Palpations: Abdomen is soft.  ?   Tenderness: There is no abdominal tenderness. There is no guarding.  ?Musculoskeletal:     ?   General: No swelling or deformity.  ?Skin: ?   General: Skin is warm.  ?   Coloration: Skin is not jaundiced.  ?Neurological:  ?   Mental Status: She is alert and  oriented to person, place, and time.  ?Psychiatric:     ?   Mood and Affect: Mood normal.     ?   Behavior: Behavior normal.     ?   Thought Content: Thought content normal.     ?   Judgment: Judgment normal.  ? ? ?Assessment & Plan:  ?Chronic bilateral thoracic back pain ? ?Neck pain ? ?Symptomatic mammary hypertrophy ? ?The procedure the patient selected and that was best for the patient was discussed. The risk were discussed and include but not limited to the following:  Breast asymmetry, fluid accumulation, firmness of the breast, inability to breast feed, loss of nipple or areola, skin loss, change in skin and nipple sensation, fat necrosis of the breast tissue, bleeding, infection and healing delay.  There are risks of anesthesia and injury to nerves or blood vessels.  Allergic reaction to tape, suture and skin glue are possible.  There will be swelling.  Any of these can lead to the need for revisional surgery.  A breast reduction has potential to interfere with diagnostic procedures in the future.  This procedure is best done when the breast is fully developed.  Changes in the breast will continue to occur over time: pregnancy, weight gain or weigh loss. ? ?The patient is interested in excision of the excess skin on her lateral axillary area.  This could be done with a combination of liposuction and excision.  She does not want any excess tissue removed from her breast just the lateral breast axillary area.  This would likely lead to some excess skin but she would have less bulkiness. ? ? ?Pictures were obtained of the patient and placed in the chart with the patient's or guardian's permission. ? ? ?Loel Lofty Walda Hertzog, DO ?

## 2021-05-18 ENCOUNTER — Telehealth: Payer: Self-pay

## 2021-05-18 NOTE — Telephone Encounter (Signed)
Patient did not have Mychart set up, phone number is in correct, possible the e-mail address. Therefore mailed copy of quote to patient. If she calls, please verify all contact information. ?

## 2021-05-18 NOTE — Telephone Encounter (Signed)
error 

## 2021-05-20 ENCOUNTER — Encounter: Payer: Self-pay | Admitting: Plastic Surgery

## 2021-12-13 ENCOUNTER — Other Ambulatory Visit: Payer: Self-pay | Admitting: Family Medicine

## 2021-12-13 DIAGNOSIS — Z1231 Encounter for screening mammogram for malignant neoplasm of breast: Secondary | ICD-10-CM

## 2022-01-10 ENCOUNTER — Ambulatory Visit
Admission: RE | Admit: 2022-01-10 | Discharge: 2022-01-10 | Disposition: A | Discharge status: Home / Self Care | Payer: BC Managed Care – PPO | Source: Ambulatory Visit | Attending: Family Medicine | Admitting: Family Medicine

## 2022-01-10 DIAGNOSIS — Z1231 Encounter for screening mammogram for malignant neoplasm of breast: Secondary | ICD-10-CM | POA: Diagnosis not present

## 2022-12-23 ENCOUNTER — Other Ambulatory Visit: Payer: Self-pay | Admitting: Family Medicine

## 2022-12-23 DIAGNOSIS — Z1231 Encounter for screening mammogram for malignant neoplasm of breast: Secondary | ICD-10-CM

## 2023-01-13 ENCOUNTER — Ambulatory Visit
Admission: RE | Admit: 2023-01-13 | Discharge: 2023-01-13 | Disposition: A | Discharge status: Home / Self Care | Payer: BC Managed Care – PPO | Source: Ambulatory Visit | Attending: Family Medicine | Admitting: Family Medicine

## 2023-01-13 DIAGNOSIS — Z1231 Encounter for screening mammogram for malignant neoplasm of breast: Secondary | ICD-10-CM | POA: Diagnosis present

## 2023-07-03 IMAGING — MG MM DIGITAL SCREENING BILAT W/ TOMO AND CAD
6 of 10 series · 6 of 30 positions shown · non-contrast
Comparison: Previous exam(s).

ACR Breast Density Category a: The breast tissue is almost entirely
fatty.

CLINICAL DATA: Screening.

EXAM:
DIGITAL SCREENING BILATERAL MAMMOGRAM WITH TOMOSYNTHESIS AND CAD
TECHNIQUE: Bilateral screening digital craniocaudal and mediolateral oblique
mammograms were obtained. Bilateral screening digital breast
tomosynthesis was performed. The images were evaluated with
computer-aided detection.

[L MLO synth-2D]
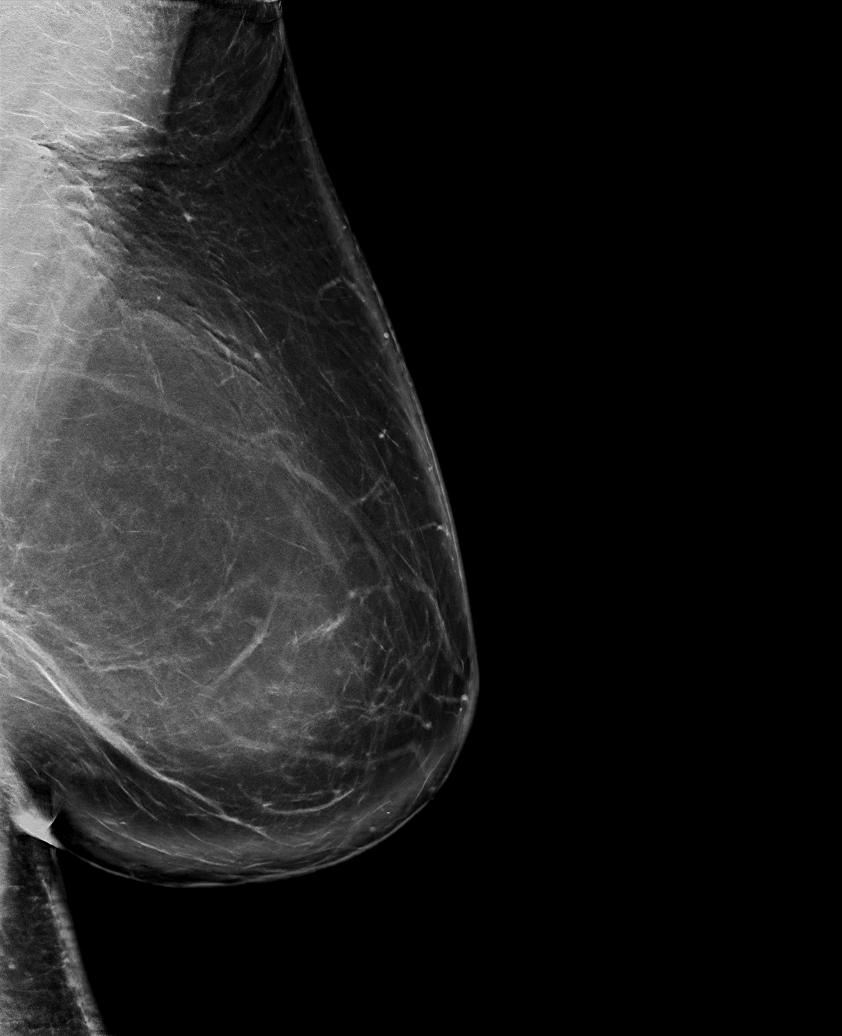

[L CC synth-2D]
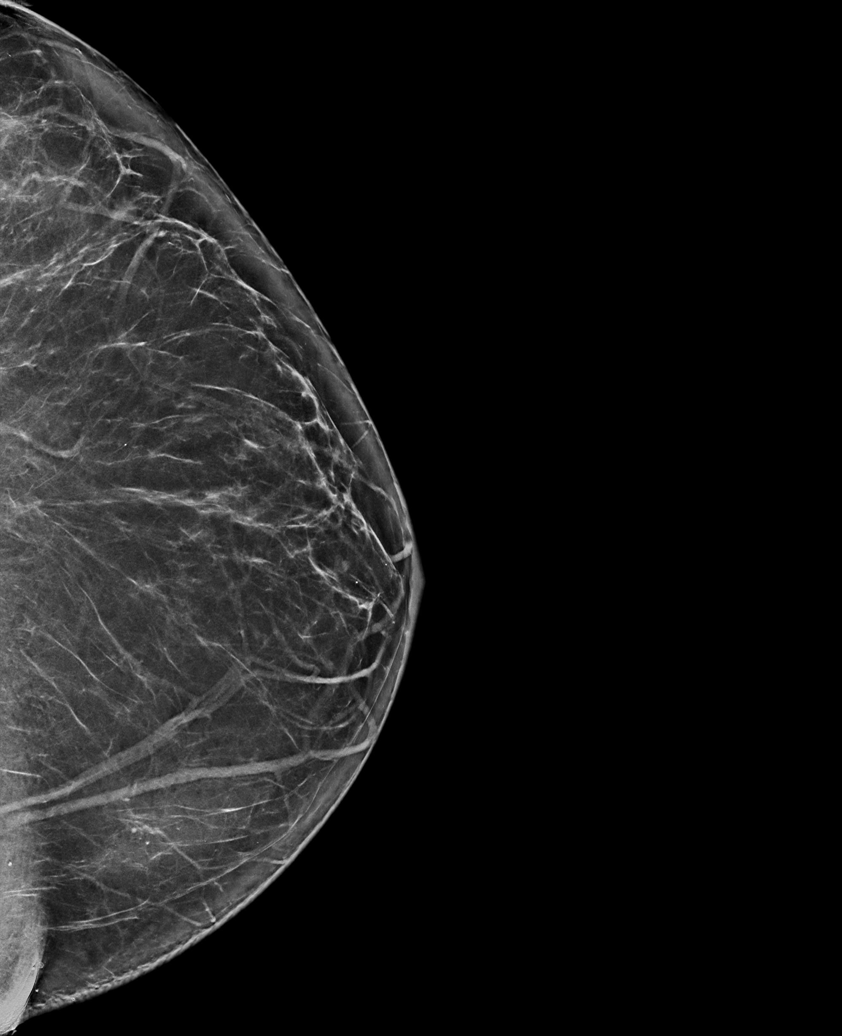

[R MLO synth-2D]
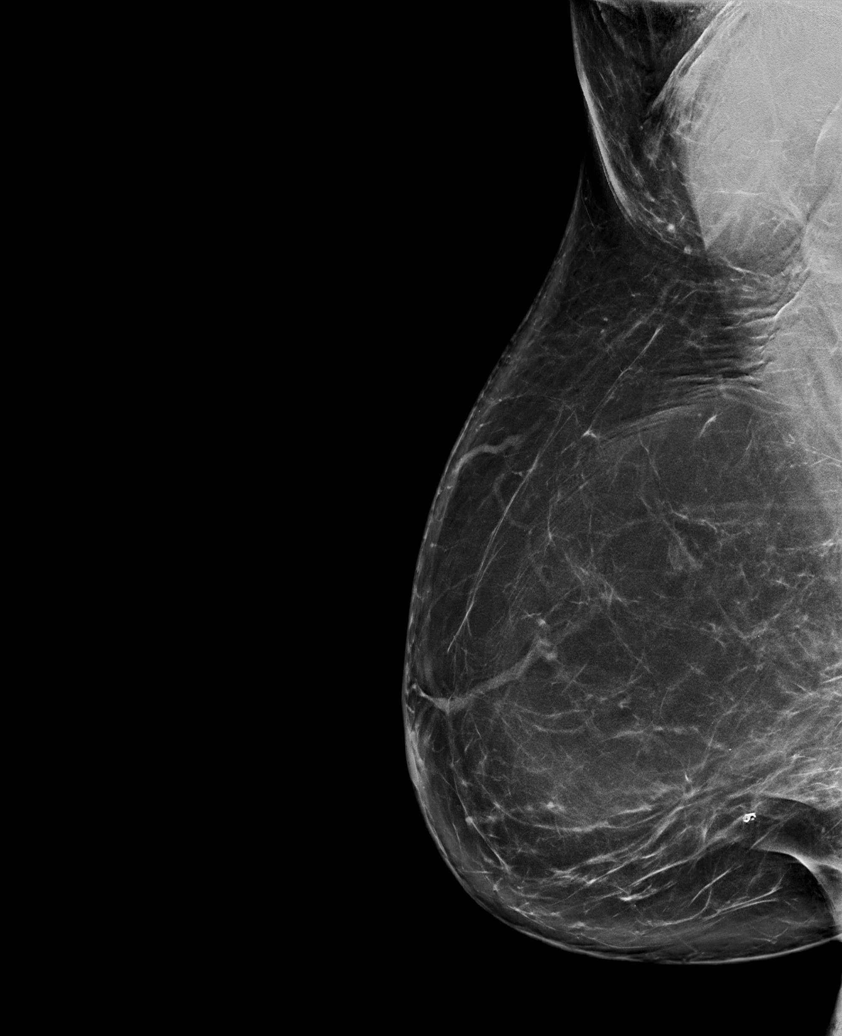

[R CC synth-2D (1 of 2)]
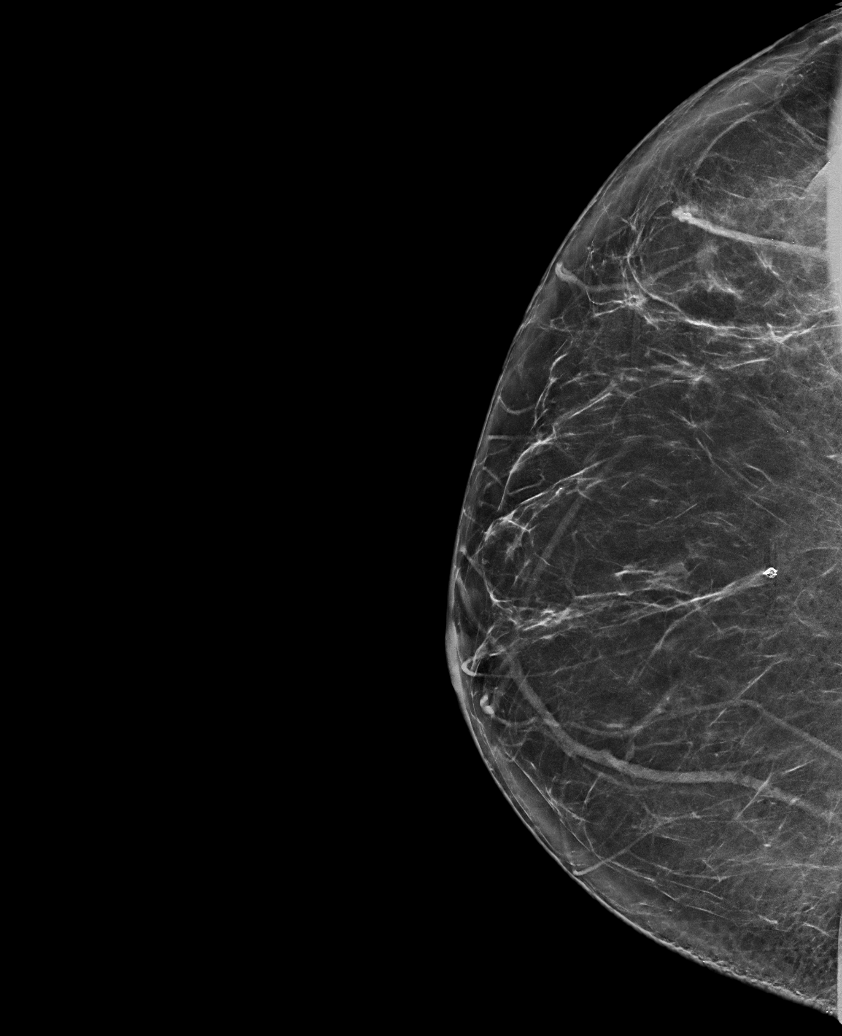

[R CC synth-2D (2 of 2)]
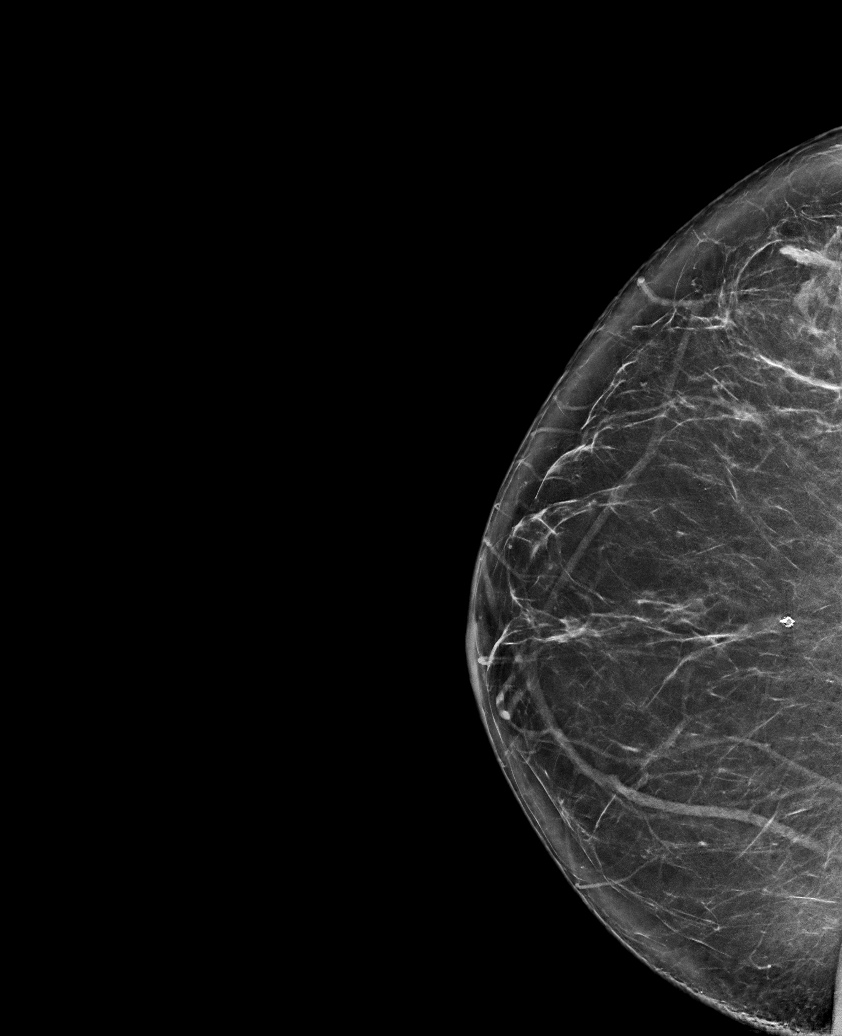

[R CC tomo · tomo slice 39/78.0]
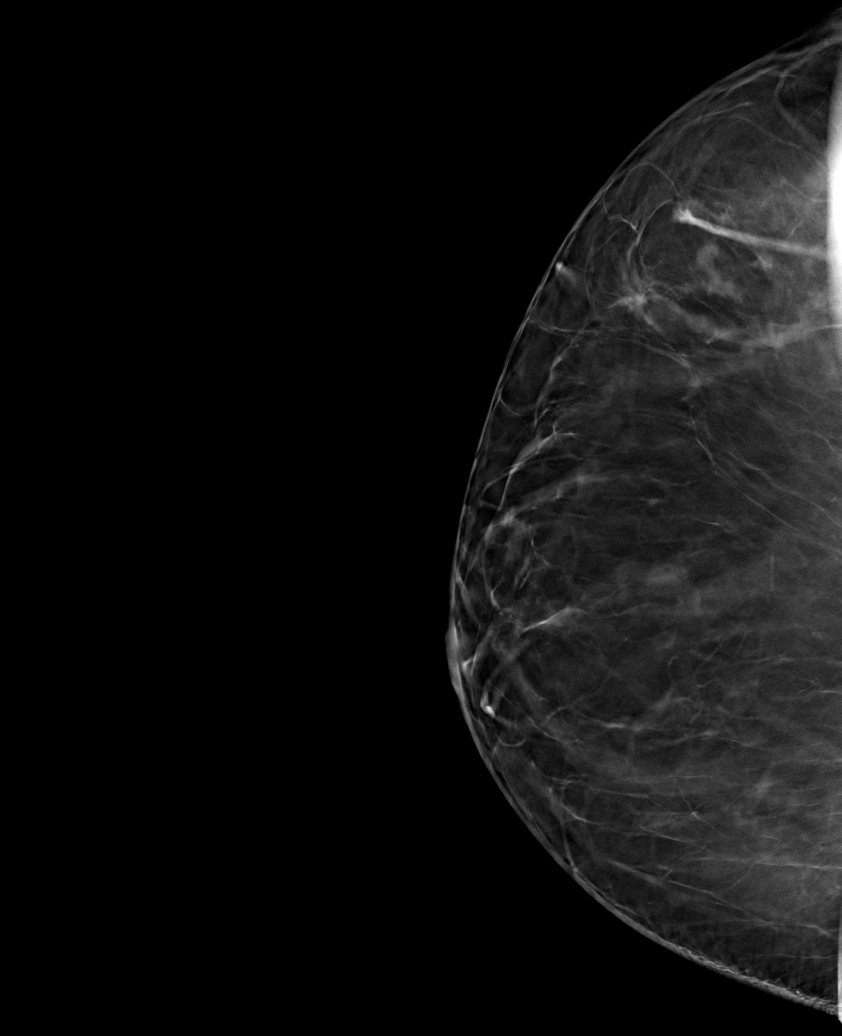

[6 of 30 positions shown; findings below may reference images not displayed]

FINDINGS: There are no findings suspicious for malignancy.
IMPRESSION: No mammographic evidence of malignancy. A result letter of this
screening mammogram will be mailed directly to the patient.

RECOMMENDATION:
Screening mammogram in one year. (Code:0E-3-N98)

BI-RADS CATEGORY  1: Negative.

## 2024-01-19 ENCOUNTER — Other Ambulatory Visit: Payer: Self-pay | Admitting: Family Medicine

## 2024-01-19 DIAGNOSIS — Z1231 Encounter for screening mammogram for malignant neoplasm of breast: Secondary | ICD-10-CM

## 2024-03-07 ENCOUNTER — Ambulatory Visit
Admission: RE | Admit: 2024-03-07 | Discharge: 2024-03-07 | Disposition: A | Discharge status: Home / Self Care | Source: Ambulatory Visit | Attending: Family Medicine | Admitting: Family Medicine

## 2024-03-07 DIAGNOSIS — Z1231 Encounter for screening mammogram for malignant neoplasm of breast: Secondary | ICD-10-CM | POA: Insufficient documentation
